# Patient Record
Sex: Male | Born: 1965 | Race: Asian | Hispanic: No | Marital: Married | Smoking: Never smoker
Health system: Southern US, Community
[De-identification: ages and names within clinical notes are randomized; demographics above are authoritative.]

## PROBLEM LIST (undated history)

## (undated) HISTORY — PX: APPENDECTOMY: SHX54

---

## 2010-11-15 ENCOUNTER — Encounter (INDEPENDENT_AMBULATORY_CARE_PROVIDER_SITE_OTHER): Payer: Self-pay | Admitting: *Deleted

## 2010-11-21 NOTE — Letter (Signed)
Summary: New Patient letter  [facility]  [address] [place], [place] [address]   Phone: [phone]  Fax: [phone]       11/15/2010 MRN: [id number]  Douglas Vaughan [address] [place], [place]  [address]  Dear Douglas Vaughan,  Welcome to the Gastroenterology Division at [facility].    You are scheduled to see Dr.  Leone Payor on 01-01-11 at 2:30P.M. on the 3rd floor at [facility], [address]  We ask that you try to arrive at our office 15 minutes prior to your appointment time to allow for check-in.  We would like you to complete the enclosed self-administered evaluation form prior to your visit and bring it with you on the day of your appointment.  We will review it with you.  Also, please bring a complete list of all your medications or, if you prefer, bring the medication bottles and we will list them.  Please bring your insurance card so that we may make a copy of it.  If your insurance requires a referral to see a specialist, please bring your referral form from your primary care physician.  Co-payments are due at the time of your visit and may be paid by cash, check or credit card.     Your office visit will consist of a consult with your physician (includes a physical exam), any laboratory testing he/she may order, scheduling of any necessary diagnostic testing (e.g. x-ray, ultrasound, CT-scan), and scheduling of a procedure (e.g. Endoscopy, Colonoscopy) if required.  Please allow enough time on your schedule to allow for any/all of these possibilities.    If you cannot keep your appointment, please call [phone] to cancel or reschedule prior to your appointment date.  This allows [place] the opportunity to schedule an appointment for another patient in need of care.  If you do not cancel or reschedule by 5 p.m. the business day prior to your appointment date, you will be charged a $50.00 late cancellation/no-show fee.    Thank you for choosing  [facility] for your medical needs.  We appreciate the opportunity to care for you.  Please visit [place] at our website  to learn more about our practice.                     Sincerely,                                                             The Gastroenterology Division

## 2011-01-01 ENCOUNTER — Ambulatory Visit: Payer: Self-pay | Admitting: Internal Medicine

## 2013-02-11 ENCOUNTER — Other Ambulatory Visit (HOSPITAL_BASED_OUTPATIENT_CLINIC_OR_DEPARTMENT_OTHER): Payer: Self-pay | Admitting: Family Medicine

## 2013-02-11 DIAGNOSIS — R6889 Other general symptoms and signs: Secondary | ICD-10-CM

## 2013-02-16 ENCOUNTER — Ambulatory Visit (HOSPITAL_BASED_OUTPATIENT_CLINIC_OR_DEPARTMENT_OTHER)
Admission: RE | Admit: 2013-02-16 | Discharge: 2013-02-16 | Disposition: A | Discharge status: Home / Self Care | Payer: BC Managed Care – PPO | Source: Ambulatory Visit | Attending: Family Medicine | Admitting: Family Medicine

## 2013-02-16 DIAGNOSIS — J3489 Other specified disorders of nose and nasal sinuses: Secondary | ICD-10-CM | POA: Insufficient documentation

## 2013-02-16 DIAGNOSIS — R6889 Other general symptoms and signs: Secondary | ICD-10-CM

## 2013-02-16 DIAGNOSIS — R0989 Other specified symptoms and signs involving the circulatory and respiratory systems: Secondary | ICD-10-CM | POA: Insufficient documentation

## 2017-03-29 DIAGNOSIS — Z0000 Encounter for general adult medical examination without abnormal findings: Secondary | ICD-10-CM | POA: Diagnosis not present

## 2017-03-29 DIAGNOSIS — Z136 Encounter for screening for cardiovascular disorders: Secondary | ICD-10-CM | POA: Diagnosis not present

## 2018-03-29 ENCOUNTER — Emergency Department (HOSPITAL_BASED_OUTPATIENT_CLINIC_OR_DEPARTMENT_OTHER)
Admission: EM | Admit: 2018-03-29 | Discharge: 2018-03-30 | Disposition: A | Discharge status: Home / Self Care | Payer: 59 | Attending: Emergency Medicine | Admitting: Emergency Medicine

## 2018-03-29 ENCOUNTER — Encounter (HOSPITAL_BASED_OUTPATIENT_CLINIC_OR_DEPARTMENT_OTHER): Payer: Self-pay | Admitting: *Deleted

## 2018-03-29 ENCOUNTER — Other Ambulatory Visit: Payer: Self-pay

## 2018-03-29 ENCOUNTER — Emergency Department (HOSPITAL_BASED_OUTPATIENT_CLINIC_OR_DEPARTMENT_OTHER): Payer: 59

## 2018-03-29 DIAGNOSIS — Y929 Unspecified place or not applicable: Secondary | ICD-10-CM | POA: Diagnosis not present

## 2018-03-29 DIAGNOSIS — W1789XA Other fall from one level to another, initial encounter: Secondary | ICD-10-CM | POA: Diagnosis not present

## 2018-03-29 DIAGNOSIS — S52502A Unspecified fracture of the lower end of left radius, initial encounter for closed fracture: Secondary | ICD-10-CM | POA: Diagnosis not present

## 2018-03-29 DIAGNOSIS — S59902A Unspecified injury of left elbow, initial encounter: Secondary | ICD-10-CM | POA: Diagnosis present

## 2018-03-29 DIAGNOSIS — Y999 Unspecified external cause status: Secondary | ICD-10-CM | POA: Diagnosis not present

## 2018-03-29 DIAGNOSIS — Y939 Activity, unspecified: Secondary | ICD-10-CM | POA: Diagnosis not present

## 2018-03-29 DIAGNOSIS — S52122A Displaced fracture of head of left radius, initial encounter for closed fracture: Secondary | ICD-10-CM | POA: Diagnosis not present

## 2018-03-29 DIAGNOSIS — S52352A Displaced comminuted fracture of shaft of radius, left arm, initial encounter for closed fracture: Secondary | ICD-10-CM | POA: Diagnosis not present

## 2018-03-29 MED ORDER — IBUPROFEN 400 MG PO TABS
600.0000 mg | ORAL_TABLET | Freq: Once | ORAL | Status: AC
  Administered 2018-03-29: 600 mg via ORAL
  Filled 2018-03-29: qty 1

## 2018-03-29 MED ORDER — OXYCODONE-ACETAMINOPHEN 5-325 MG PO TABS
1.0000 | ORAL_TABLET | Freq: Once | ORAL | Status: AC
  Administered 2018-03-29: 1 via ORAL
  Filled 2018-03-29: qty 1

## 2018-03-29 MED ORDER — OXYCODONE-ACETAMINOPHEN 5-325 MG PO TABS: 1.0000 | ORAL_TABLET | Freq: Four times a day (QID) | ORAL | 0 refills | Status: AC | PRN

## 2018-03-29 MED ORDER — NAPROXEN 500 MG PO TABS: 500.0000 mg | ORAL_TABLET | Freq: Two times a day (BID) | ORAL | 0 refills | Status: AC

## 2018-03-29 NOTE — ED Notes (Signed)
PMS intact before and after. Pt tolerated well. All questions answered. 

## 2018-03-29 NOTE — ED Notes (Signed)
Wrist re-splinted for XR.

## 2018-03-29 NOTE — ED Triage Notes (Signed)
Pt states he fell off boat trailer onto grass. C/o pain in left wrist which he splinted with chopsticks pta

## 2018-03-29 NOTE — ED Provider Notes (Signed)
[facility] EMERGENCY DEPARTMENT Provider Note   CSN: [id number] Arrival date & time: 03/29/18  2213     History   Chief Complaint Chief Complaint  Patient presents with  . Fall    HPI Douglas Vaughan is a 52 y.o. male.  Patient presents with acute onset of left wrist pain after falling several feet off of a boat trailer onto grass.  Patient fell onto an outstretched hand.  He had immediate pain.  Patient immobilized himself in a homemade splint.  Incident occurred around 9 PM.  Patient hit the front of his head but did not lose consciousness.  He has not had any vomiting, vision change, weakness in the arms or his legs.  He is not confused and is acting normally per his wife.  No treatments prior to arrival.  Onset of symptoms acute.  Course is constant.  Movement makes the pain worse.  Nothing makes it better.     History reviewed. No pertinent past medical history.  There are no active problems to display for this patient.   Past Surgical History:  Procedure Laterality Date  . APPENDECTOMY          Home Medications    Prior to Admission medications   Medication Sig Start Date End Date Taking? Authorizing Provider  naproxen (NAPROSYN) 500 MG tablet Take 1 tablet (500 mg total) by mouth 2 (two) times daily. 03/29/18   Renne CriglerGeiple, Katsumi Wisler, PA-C  oxyCODONE-acetaminophen (PERCOCET/ROXICET) 5-325 MG tablet Take 1 tablet by mouth every 6 (six) hours as needed for severe pain. 03/29/18   Renne CriglerGeiple, Chene Kasinger, PA-C    Family History No family history on file.  Social History Social History   Tobacco Use  . Smoking status: Never Smoker  . Smokeless tobacco: Never Used  Substance Use Topics  . Alcohol use: Never    Frequency: Never  . Drug use: Never     Allergies   Patient has no known allergies.   Review of Systems Review of Systems  Constitutional: Negative for activity change and fatigue.  HENT: Negative for tinnitus.   Eyes: Negative for photophobia, pain  and visual disturbance.  Respiratory: Negative for shortness of breath.   Cardiovascular: Negative for chest pain.  Gastrointestinal: Negative for nausea and vomiting.  Musculoskeletal: Positive for arthralgias. Negative for back pain, gait problem, joint swelling and neck pain.  Skin: Negative for wound.  Neurological: Negative for dizziness, weakness, light-headedness, numbness and headaches.  Psychiatric/Behavioral: Negative for confusion and decreased concentration.     Physical Exam Updated Vital Signs BP 112/70 (BP Location: Left Arm)   Pulse 65   Temp 98.3 F (36.8 C) (Oral)   Resp 20   Ht 5\' 11"  (1.803 m)   Wt 81.6 kg (180 lb)   SpO2 100%   BMI 25.10 kg/m   Physical Exam  Constitutional: He is oriented to person, place, and time. He appears well-developed and well-nourished.  HENT:  Head: Normocephalic. Head is without raccoon's eyes and without Battle's sign.  Right Ear: Tympanic membrane, external ear and ear canal normal. No hemotympanum.  Left Ear: Tympanic membrane, external ear and ear canal normal. No hemotympanum.  Nose: Nose normal. No nasal septal hematoma.  Mouth/Throat: Oropharynx is clear and moist.  Minimal forehead abrasion  Eyes: Pupils are equal, round, and reactive to light. Conjunctivae, EOM and lids are normal.  No visible hyphema  Neck: Normal range of motion. Neck supple.  Cardiovascular: Normal rate, regular rhythm and normal pulses. Exam reveals no  decreased pulses.  Pulses:      Radial pulses are 2+ on the right side, and 2+ on the left side.  Pulmonary/Chest: Effort normal and breath sounds normal.  Abdominal: Soft. There is no tenderness.  Musculoskeletal: He exhibits tenderness. He exhibits no edema.       Left shoulder: Normal.       Left elbow: Normal.       Left wrist: He exhibits decreased range of motion, tenderness and bony tenderness.       Cervical back: He exhibits normal range of motion, no tenderness and no bony tenderness.         Thoracic back: He exhibits no tenderness and no bony tenderness.       Lumbar back: He exhibits no tenderness and no bony tenderness.       Left forearm: He exhibits tenderness, bony tenderness, swelling and deformity (minimal). He exhibits no laceration.       Left hand: He exhibits normal range of motion and no tenderness.  Neurological: He is alert and oriented to person, place, and time. He has normal strength and normal reflexes. No cranial nerve deficit or sensory deficit. Coordination normal. GCS eye subscore is 4. GCS verbal subscore is 5. GCS motor subscore is 6.  Motor, sensation, and vascular distal to the injury is fully intact.   Skin: Skin is warm and dry.  Psychiatric: He has a normal mood and affect.  Nursing note and vitals reviewed.    ED Treatments / Results  Labs (all labs ordered are listed, but only abnormal results are displayed) Labs Reviewed - No data to display  EKG None  Radiology Dg Wrist Complete Left  Result Date: 03/29/2018 CLINICAL DATA:  Fall from boat trailer onto outstretched hand. Left wrist pain and deformity. Initial encounter. EXAM: LEFT WRIST - COMPLETE 3+ VIEW COMPARISON:  None. FINDINGS: A comminuted fracture of the distal radius is seen with involvement of the distal radial ulnar and radiocarpal joints. There is moderate dorsal angulation of the distal articular surface the radius. No other fractures identified. Carpal bones remain normal alignment. IMPRESSION: Comminuted fracture of distal radius, with moderate dorsal angulation. Electronically Signed   By: Myles Rosenthal M.D.   On: 03/29/2018 23:02    Procedures Procedures (including critical care time)  Medications Ordered in ED Medications  oxyCODONE-acetaminophen (PERCOCET/ROXICET) 5-325 MG per tablet 1 tablet (1 tablet Oral Given 03/29/18 2328)  ibuprofen (ADVIL,MOTRIN) tablet 600 mg (600 mg Oral Given 03/29/18 2328)     Initial Impression / Assessment and Plan / ED Course  I  have reviewed the triage vital signs and the nursing notes.  Pertinent labs & imaging results that were available during my care of the patient were reviewed by me and considered in my medical decision making (see chart for details).     Patient seen and examined. X-ray reviewed with patient and wife. Medications ordered.   Vital signs reviewed and are as follows: BP 112/70 (BP Location: Left Arm)   Pulse 65   Temp 98.3 F (36.8 C) (Oral)   Resp 20   Ht 5\' 11"  (1.803 m)   Wt 81.6 kg (180 lb)   SpO2 100%   BMI 25.10 kg/m   Patient will be placed in a sugar tong splint.  Orthopedic follow-up given.  Patient will be discharged home with pain medication.  We discussed use of narcotic pain medication.   Patient counseled on use of narcotic pain medications. Counseled not to combine these  medications with others containing tylenol. Urged not to drink alcohol, drive, or perform any other activities that requires focus while taking these medications. The patient verbalizes understanding and agrees with the plan.   Final Clinical Impressions(s) / ED Diagnoses   Final diagnoses:  Closed fracture of distal end of left radius, unspecified fracture morphology, initial encounter   Patient with closed distal radius fracture, neurovascularly intact.  No elbow or shoulder pain.  Minor head injury but patient with normal exam, no indication for head imaging based on [place] head CT rules.  ED Discharge Orders        Ordered    oxyCODONE-acetaminophen (PERCOCET/ROXICET) 5-325 MG tablet  Every 6 hours PRN     03/29/18 2332    naproxen (NAPROSYN) 500 MG tablet  2 times daily     03/29/18 2332       Renne Crigler, PA-C 03/29/18 2345    Tegeler, Canary Brim, MD 03/30/18 [phone]

## 2018-03-29 NOTE — Discharge Instructions (Signed)
Please read and follow all provided instructions.  Your diagnoses today include:  1. Closed fracture of distal end of left radius, unspecified fracture morphology, initial encounter     Tests performed today include:  An x-ray of the affected area - shows distal radius fracture  Vital signs. See below for your results today.   Medications prescribed:   Percocet (oxycodone/acetaminophen) - narcotic pain medication  DO NOT drive or perform any activities that require you to be awake and alert because this medicine can make you drowsy. BE VERY CAREFUL not to take multiple medicines containing Tylenol (also called acetaminophen). Doing so can lead to an overdose which can damage your liver and cause liver failure and possibly death.   Naproxen - anti-inflammatory pain medication  Do not exceed 500mg  naproxen every 12 hours, take with food  You have been prescribed an anti-inflammatory medication or NSAID. Take with food. Take smallest effective dose for the shortest duration needed for your pain. Stop taking if you experience stomach pain or vomiting.   Take any prescribed medications only as directed.  Home care instructions:   Follow any educational materials contained in this packet  Follow R.I.C.E. Protocol:  R - rest your injury   I  - use ice on injury without applying directly to skin  C - compress injury with bandage or splint  E - elevate the injury as much as possible  Follow-up instructions: Please follow-up with Dr. Amanda PeaGramig next week.   Return instructions:   Please return if your toes or feet are numb or tingling, appear gray or blue, or you have severe pain (also elevate the leg and loosen splint or wrap if you were given one)  Please return to the Emergency Department if you experience worsening symptoms.   Please return if you have any other emergent concerns.  Additional Information:  Your vital signs today were: BP 112/70 (BP Location: Left Arm)     Pulse 65    Temp 98.3 F (36.8 C) (Oral)    Resp 20    Ht 5\' 11"  (1.803 m)    Wt 81.6 kg (180 lb)    SpO2 100%    BMI 25.10 kg/m  If your blood pressure (BP) was elevated above 135/85 this visit, please have this repeated by your doctor within one month. --------------

## 2018-03-31 DIAGNOSIS — S52572A Other intraarticular fracture of lower end of left radius, initial encounter for closed fracture: Secondary | ICD-10-CM | POA: Diagnosis not present

## 2018-04-07 DIAGNOSIS — Z1211 Encounter for screening for malignant neoplasm of colon: Secondary | ICD-10-CM | POA: Diagnosis not present

## 2018-04-07 DIAGNOSIS — Z131 Encounter for screening for diabetes mellitus: Secondary | ICD-10-CM | POA: Diagnosis not present

## 2018-04-07 DIAGNOSIS — E7800 Pure hypercholesterolemia, unspecified: Secondary | ICD-10-CM | POA: Diagnosis not present

## 2018-04-07 DIAGNOSIS — Z1159 Encounter for screening for other viral diseases: Secondary | ICD-10-CM | POA: Diagnosis not present

## 2018-04-07 DIAGNOSIS — Z23 Encounter for immunization: Secondary | ICD-10-CM | POA: Diagnosis not present

## 2018-04-07 DIAGNOSIS — Z125 Encounter for screening for malignant neoplasm of prostate: Secondary | ICD-10-CM | POA: Diagnosis not present

## 2018-04-07 DIAGNOSIS — Z0000 Encounter for general adult medical examination without abnormal findings: Secondary | ICD-10-CM | POA: Diagnosis not present

## 2018-04-09 DIAGNOSIS — S52572D Other intraarticular fracture of lower end of left radius, subsequent encounter for closed fracture with routine healing: Secondary | ICD-10-CM | POA: Diagnosis not present

## 2018-04-09 DIAGNOSIS — S52502D Unspecified fracture of the lower end of left radius, subsequent encounter for closed fracture with routine healing: Secondary | ICD-10-CM | POA: Diagnosis not present

## 2018-04-11 DIAGNOSIS — S52572D Other intraarticular fracture of lower end of left radius, subsequent encounter for closed fracture with routine healing: Secondary | ICD-10-CM | POA: Diagnosis not present

## 2018-04-11 DIAGNOSIS — G8918 Other acute postprocedural pain: Secondary | ICD-10-CM | POA: Diagnosis not present

## 2018-04-11 DIAGNOSIS — S52572A Other intraarticular fracture of lower end of left radius, initial encounter for closed fracture: Secondary | ICD-10-CM | POA: Diagnosis not present

## 2018-04-15 DIAGNOSIS — R7303 Prediabetes: Secondary | ICD-10-CM | POA: Diagnosis not present

## 2018-04-25 DIAGNOSIS — M8580 Other specified disorders of bone density and structure, unspecified site: Secondary | ICD-10-CM | POA: Diagnosis not present

## 2018-04-25 DIAGNOSIS — S52502D Unspecified fracture of the lower end of left radius, subsequent encounter for closed fracture with routine healing: Secondary | ICD-10-CM | POA: Diagnosis not present

## 2018-04-25 DIAGNOSIS — Z9889 Other specified postprocedural states: Secondary | ICD-10-CM | POA: Diagnosis not present

## 2018-05-28 DIAGNOSIS — S52502D Unspecified fracture of the lower end of left radius, subsequent encounter for closed fracture with routine healing: Secondary | ICD-10-CM | POA: Diagnosis not present

## 2018-06-03 DIAGNOSIS — M25642 Stiffness of left hand, not elsewhere classified: Secondary | ICD-10-CM | POA: Diagnosis not present

## 2018-06-03 DIAGNOSIS — S52502D Unspecified fracture of the lower end of left radius, subsequent encounter for closed fracture with routine healing: Secondary | ICD-10-CM | POA: Diagnosis not present

## 2018-06-06 DIAGNOSIS — M25642 Stiffness of left hand, not elsewhere classified: Secondary | ICD-10-CM | POA: Diagnosis not present

## 2018-06-06 DIAGNOSIS — S52502D Unspecified fracture of the lower end of left radius, subsequent encounter for closed fracture with routine healing: Secondary | ICD-10-CM | POA: Diagnosis not present

## 2018-06-10 DIAGNOSIS — S52502D Unspecified fracture of the lower end of left radius, subsequent encounter for closed fracture with routine healing: Secondary | ICD-10-CM | POA: Diagnosis not present

## 2018-06-10 DIAGNOSIS — M25642 Stiffness of left hand, not elsewhere classified: Secondary | ICD-10-CM | POA: Diagnosis not present

## 2018-06-13 DIAGNOSIS — S52502D Unspecified fracture of the lower end of left radius, subsequent encounter for closed fracture with routine healing: Secondary | ICD-10-CM | POA: Diagnosis not present

## 2018-06-13 DIAGNOSIS — M25642 Stiffness of left hand, not elsewhere classified: Secondary | ICD-10-CM | POA: Diagnosis not present

## 2018-06-15 DIAGNOSIS — Z23 Encounter for immunization: Secondary | ICD-10-CM | POA: Diagnosis not present

## 2018-06-17 DIAGNOSIS — S52502D Unspecified fracture of the lower end of left radius, subsequent encounter for closed fracture with routine healing: Secondary | ICD-10-CM | POA: Diagnosis not present

## 2018-06-17 DIAGNOSIS — M25642 Stiffness of left hand, not elsewhere classified: Secondary | ICD-10-CM | POA: Diagnosis not present

## 2018-06-20 DIAGNOSIS — M25642 Stiffness of left hand, not elsewhere classified: Secondary | ICD-10-CM | POA: Diagnosis not present

## 2018-06-20 DIAGNOSIS — S52502D Unspecified fracture of the lower end of left radius, subsequent encounter for closed fracture with routine healing: Secondary | ICD-10-CM | POA: Diagnosis not present

## 2018-06-24 DIAGNOSIS — S52502D Unspecified fracture of the lower end of left radius, subsequent encounter for closed fracture with routine healing: Secondary | ICD-10-CM | POA: Diagnosis not present

## 2018-06-24 DIAGNOSIS — M25642 Stiffness of left hand, not elsewhere classified: Secondary | ICD-10-CM | POA: Diagnosis not present

## 2018-06-27 DIAGNOSIS — M25642 Stiffness of left hand, not elsewhere classified: Secondary | ICD-10-CM | POA: Diagnosis not present

## 2018-06-27 DIAGNOSIS — S52502D Unspecified fracture of the lower end of left radius, subsequent encounter for closed fracture with routine healing: Secondary | ICD-10-CM | POA: Diagnosis not present

## 2018-07-01 DIAGNOSIS — S52502D Unspecified fracture of the lower end of left radius, subsequent encounter for closed fracture with routine healing: Secondary | ICD-10-CM | POA: Diagnosis not present

## 2018-07-01 DIAGNOSIS — M25642 Stiffness of left hand, not elsewhere classified: Secondary | ICD-10-CM | POA: Diagnosis not present

## 2018-07-04 DIAGNOSIS — M25642 Stiffness of left hand, not elsewhere classified: Secondary | ICD-10-CM | POA: Diagnosis not present

## 2018-07-04 DIAGNOSIS — S52502D Unspecified fracture of the lower end of left radius, subsequent encounter for closed fracture with routine healing: Secondary | ICD-10-CM | POA: Diagnosis not present

## 2018-07-08 DIAGNOSIS — S52502D Unspecified fracture of the lower end of left radius, subsequent encounter for closed fracture with routine healing: Secondary | ICD-10-CM | POA: Diagnosis not present

## 2018-07-08 DIAGNOSIS — M25642 Stiffness of left hand, not elsewhere classified: Secondary | ICD-10-CM | POA: Diagnosis not present

## 2018-07-11 DIAGNOSIS — M25642 Stiffness of left hand, not elsewhere classified: Secondary | ICD-10-CM | POA: Diagnosis not present

## 2018-07-11 DIAGNOSIS — S52502D Unspecified fracture of the lower end of left radius, subsequent encounter for closed fracture with routine healing: Secondary | ICD-10-CM | POA: Diagnosis not present

## 2018-07-13 DIAGNOSIS — Z23 Encounter for immunization: Secondary | ICD-10-CM | POA: Diagnosis not present

## 2018-07-15 DIAGNOSIS — S52502D Unspecified fracture of the lower end of left radius, subsequent encounter for closed fracture with routine healing: Secondary | ICD-10-CM | POA: Diagnosis not present

## 2018-07-15 DIAGNOSIS — M25642 Stiffness of left hand, not elsewhere classified: Secondary | ICD-10-CM | POA: Diagnosis not present

## 2018-07-22 DIAGNOSIS — M25642 Stiffness of left hand, not elsewhere classified: Secondary | ICD-10-CM | POA: Diagnosis not present

## 2018-07-25 DIAGNOSIS — M25642 Stiffness of left hand, not elsewhere classified: Secondary | ICD-10-CM | POA: Diagnosis not present

## 2018-07-29 DIAGNOSIS — M25642 Stiffness of left hand, not elsewhere classified: Secondary | ICD-10-CM | POA: Diagnosis not present

## 2018-07-29 DIAGNOSIS — S52502D Unspecified fracture of the lower end of left radius, subsequent encounter for closed fracture with routine healing: Secondary | ICD-10-CM | POA: Diagnosis not present

## 2018-07-30 DIAGNOSIS — Z1211 Encounter for screening for malignant neoplasm of colon: Secondary | ICD-10-CM | POA: Diagnosis not present

## 2018-07-30 DIAGNOSIS — S52502D Unspecified fracture of the lower end of left radius, subsequent encounter for closed fracture with routine healing: Secondary | ICD-10-CM | POA: Diagnosis not present

## 2018-07-30 DIAGNOSIS — M7541 Impingement syndrome of right shoulder: Secondary | ICD-10-CM | POA: Diagnosis not present

## 2018-08-01 DIAGNOSIS — S52502D Unspecified fracture of the lower end of left radius, subsequent encounter for closed fracture with routine healing: Secondary | ICD-10-CM | POA: Diagnosis not present

## 2018-08-01 DIAGNOSIS — M25642 Stiffness of left hand, not elsewhere classified: Secondary | ICD-10-CM | POA: Diagnosis not present

## 2018-08-04 DIAGNOSIS — S52502D Unspecified fracture of the lower end of left radius, subsequent encounter for closed fracture with routine healing: Secondary | ICD-10-CM | POA: Diagnosis not present

## 2018-08-04 DIAGNOSIS — M25642 Stiffness of left hand, not elsewhere classified: Secondary | ICD-10-CM | POA: Diagnosis not present

## 2018-08-06 DIAGNOSIS — M25642 Stiffness of left hand, not elsewhere classified: Secondary | ICD-10-CM | POA: Diagnosis not present

## 2018-08-06 DIAGNOSIS — S52502D Unspecified fracture of the lower end of left radius, subsequent encounter for closed fracture with routine healing: Secondary | ICD-10-CM | POA: Diagnosis not present

## 2018-08-14 DIAGNOSIS — M25642 Stiffness of left hand, not elsewhere classified: Secondary | ICD-10-CM | POA: Diagnosis not present

## 2018-08-14 DIAGNOSIS — S52502D Unspecified fracture of the lower end of left radius, subsequent encounter for closed fracture with routine healing: Secondary | ICD-10-CM | POA: Diagnosis not present

## 2018-08-20 DIAGNOSIS — M25642 Stiffness of left hand, not elsewhere classified: Secondary | ICD-10-CM | POA: Diagnosis not present

## 2018-08-20 DIAGNOSIS — S52502D Unspecified fracture of the lower end of left radius, subsequent encounter for closed fracture with routine healing: Secondary | ICD-10-CM | POA: Diagnosis not present

## 2018-08-22 DIAGNOSIS — Z23 Encounter for immunization: Secondary | ICD-10-CM | POA: Diagnosis not present

## 2018-08-26 DIAGNOSIS — M25642 Stiffness of left hand, not elsewhere classified: Secondary | ICD-10-CM | POA: Diagnosis not present

## 2018-08-26 DIAGNOSIS — S52502D Unspecified fracture of the lower end of left radius, subsequent encounter for closed fracture with routine healing: Secondary | ICD-10-CM | POA: Diagnosis not present

## 2018-08-27 DIAGNOSIS — S52502D Unspecified fracture of the lower end of left radius, subsequent encounter for closed fracture with routine healing: Secondary | ICD-10-CM | POA: Diagnosis not present

## 2018-08-27 DIAGNOSIS — M25642 Stiffness of left hand, not elsewhere classified: Secondary | ICD-10-CM | POA: Diagnosis not present

## 2018-08-28 DIAGNOSIS — D120 Benign neoplasm of cecum: Secondary | ICD-10-CM | POA: Diagnosis not present

## 2018-08-28 DIAGNOSIS — D123 Benign neoplasm of transverse colon: Secondary | ICD-10-CM | POA: Diagnosis not present

## 2018-08-28 DIAGNOSIS — D125 Benign neoplasm of sigmoid colon: Secondary | ICD-10-CM | POA: Diagnosis not present

## 2018-08-28 DIAGNOSIS — Z1211 Encounter for screening for malignant neoplasm of colon: Secondary | ICD-10-CM | POA: Diagnosis not present

## 2018-08-28 DIAGNOSIS — K635 Polyp of colon: Secondary | ICD-10-CM | POA: Diagnosis not present

## 2018-08-29 DIAGNOSIS — Z713 Dietary counseling and surveillance: Secondary | ICD-10-CM | POA: Diagnosis not present

## 2018-08-29 DIAGNOSIS — Z6832 Body mass index (BMI) 32.0-32.9, adult: Secondary | ICD-10-CM | POA: Diagnosis not present

## 2018-09-02 DIAGNOSIS — S52502D Unspecified fracture of the lower end of left radius, subsequent encounter for closed fracture with routine healing: Secondary | ICD-10-CM | POA: Diagnosis not present

## 2018-09-02 DIAGNOSIS — M25642 Stiffness of left hand, not elsewhere classified: Secondary | ICD-10-CM | POA: Diagnosis not present

## 2018-09-12 DIAGNOSIS — S52502D Unspecified fracture of the lower end of left radius, subsequent encounter for closed fracture with routine healing: Secondary | ICD-10-CM | POA: Diagnosis not present

## 2018-09-12 DIAGNOSIS — M25642 Stiffness of left hand, not elsewhere classified: Secondary | ICD-10-CM | POA: Diagnosis not present

## 2018-09-19 DIAGNOSIS — S52502D Unspecified fracture of the lower end of left radius, subsequent encounter for closed fracture with routine healing: Secondary | ICD-10-CM | POA: Diagnosis not present

## 2018-09-19 DIAGNOSIS — M25642 Stiffness of left hand, not elsewhere classified: Secondary | ICD-10-CM | POA: Diagnosis not present

## 2018-09-26 DIAGNOSIS — S52502D Unspecified fracture of the lower end of left radius, subsequent encounter for closed fracture with routine healing: Secondary | ICD-10-CM | POA: Diagnosis not present

## 2018-09-26 DIAGNOSIS — M25642 Stiffness of left hand, not elsewhere classified: Secondary | ICD-10-CM | POA: Diagnosis not present

## 2018-10-03 DIAGNOSIS — M25642 Stiffness of left hand, not elsewhere classified: Secondary | ICD-10-CM | POA: Diagnosis not present

## 2018-10-03 DIAGNOSIS — S52502D Unspecified fracture of the lower end of left radius, subsequent encounter for closed fracture with routine healing: Secondary | ICD-10-CM | POA: Diagnosis not present

## 2018-10-10 DIAGNOSIS — S52502D Unspecified fracture of the lower end of left radius, subsequent encounter for closed fracture with routine healing: Secondary | ICD-10-CM | POA: Diagnosis not present

## 2018-10-10 DIAGNOSIS — M25642 Stiffness of left hand, not elsewhere classified: Secondary | ICD-10-CM | POA: Diagnosis not present

## 2018-10-17 DIAGNOSIS — S52502D Unspecified fracture of the lower end of left radius, subsequent encounter for closed fracture with routine healing: Secondary | ICD-10-CM | POA: Diagnosis not present

## 2018-10-17 DIAGNOSIS — M25642 Stiffness of left hand, not elsewhere classified: Secondary | ICD-10-CM | POA: Diagnosis not present

## 2018-10-27 DIAGNOSIS — S52502D Unspecified fracture of the lower end of left radius, subsequent encounter for closed fracture with routine healing: Secondary | ICD-10-CM | POA: Diagnosis not present

## 2018-10-27 DIAGNOSIS — M25642 Stiffness of left hand, not elsewhere classified: Secondary | ICD-10-CM | POA: Diagnosis not present

## 2018-12-27 IMAGING — CR DG WRIST COMPLETE 3+V*L*
4 series · 4 of 4 positions shown · non-contrast
Comparison: None.

CLINICAL DATA: Fall from boat trailer onto outstretched hand. Left
wrist pain and deformity. Initial encounter.

EXAM:
LEFT WRIST - COMPLETE 3+ VIEW

[x wrist pa left]
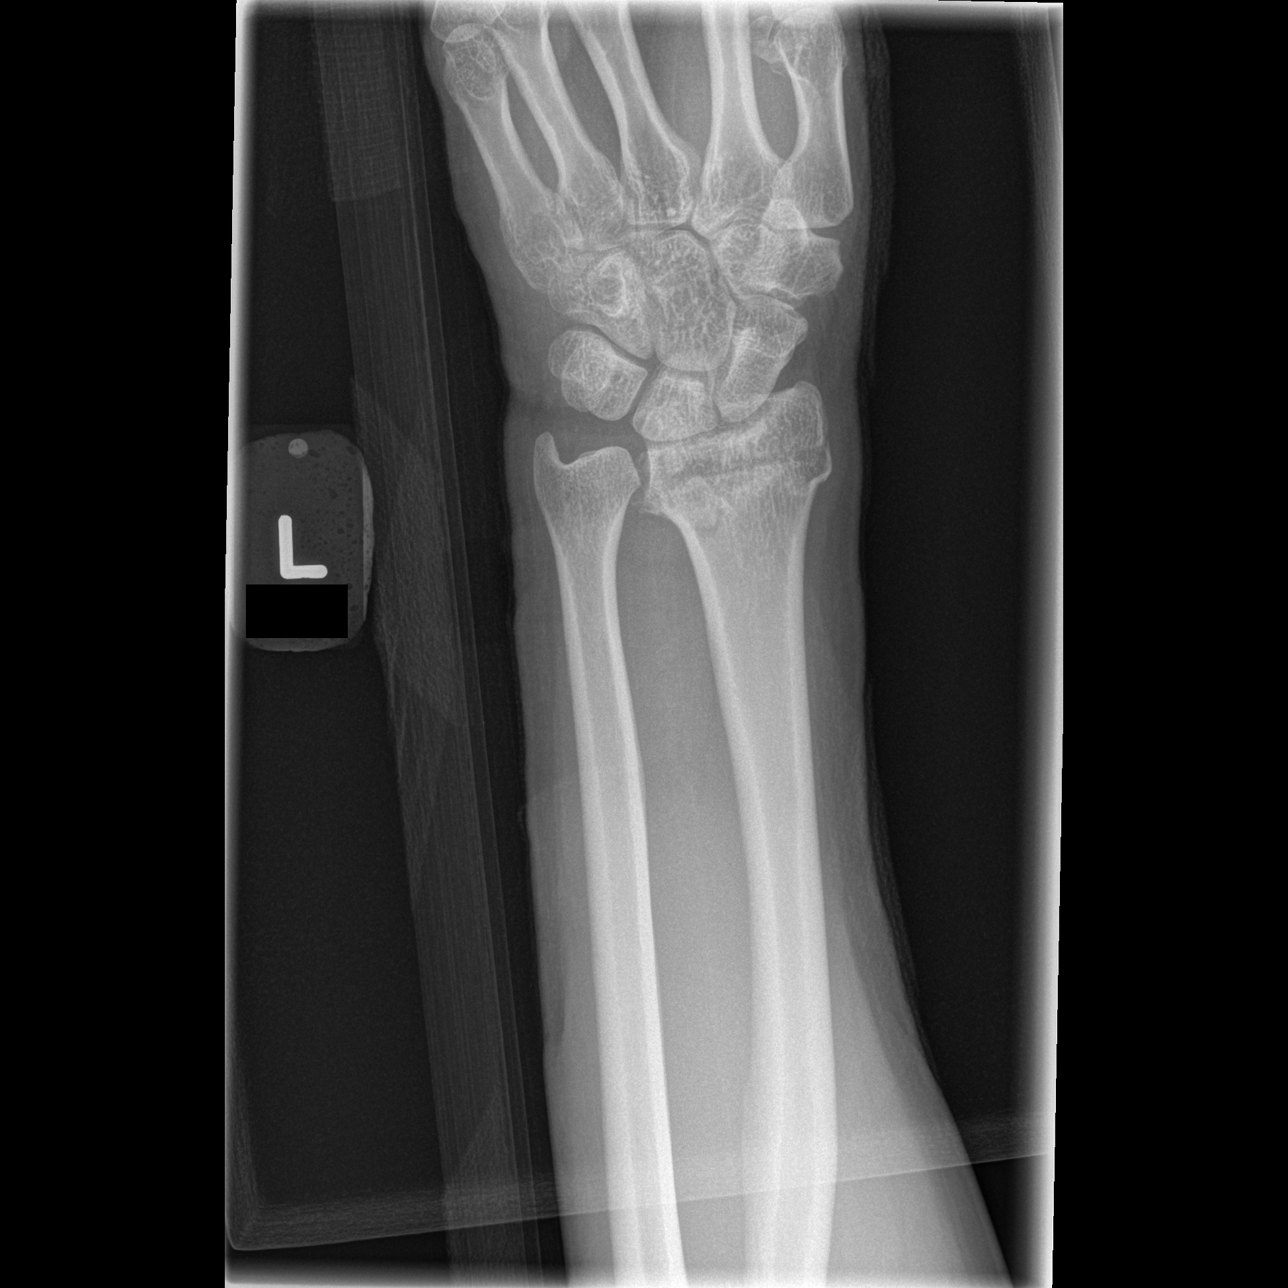

[x wrist obl left]
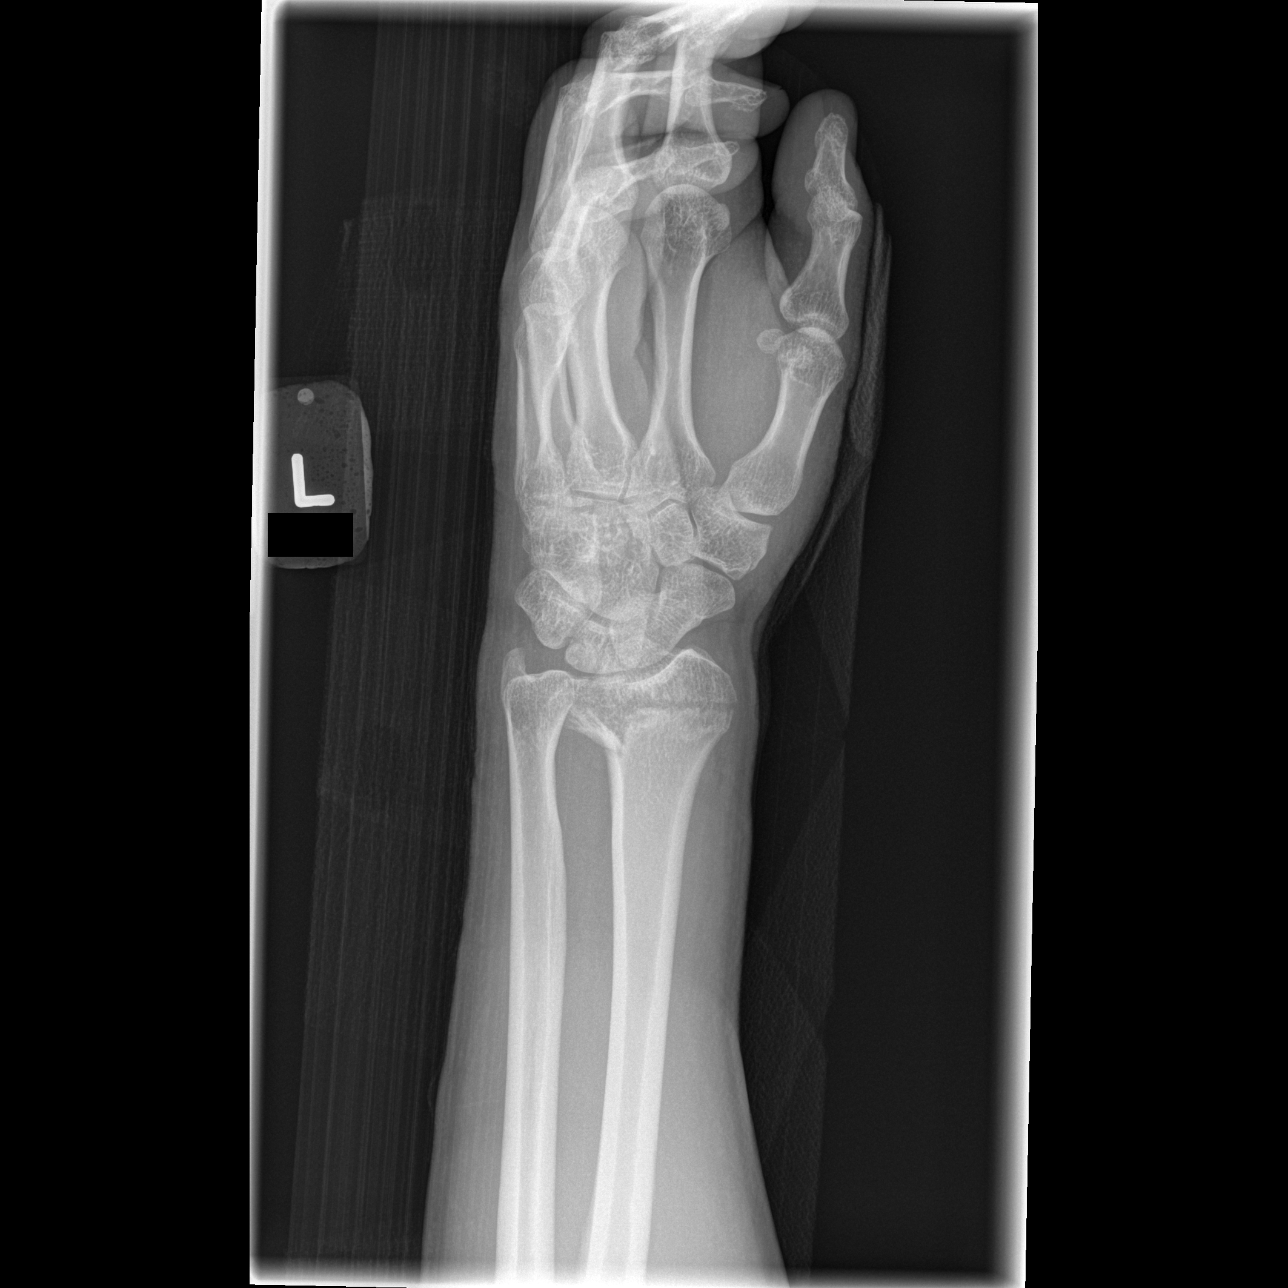

[x wrist lat left]
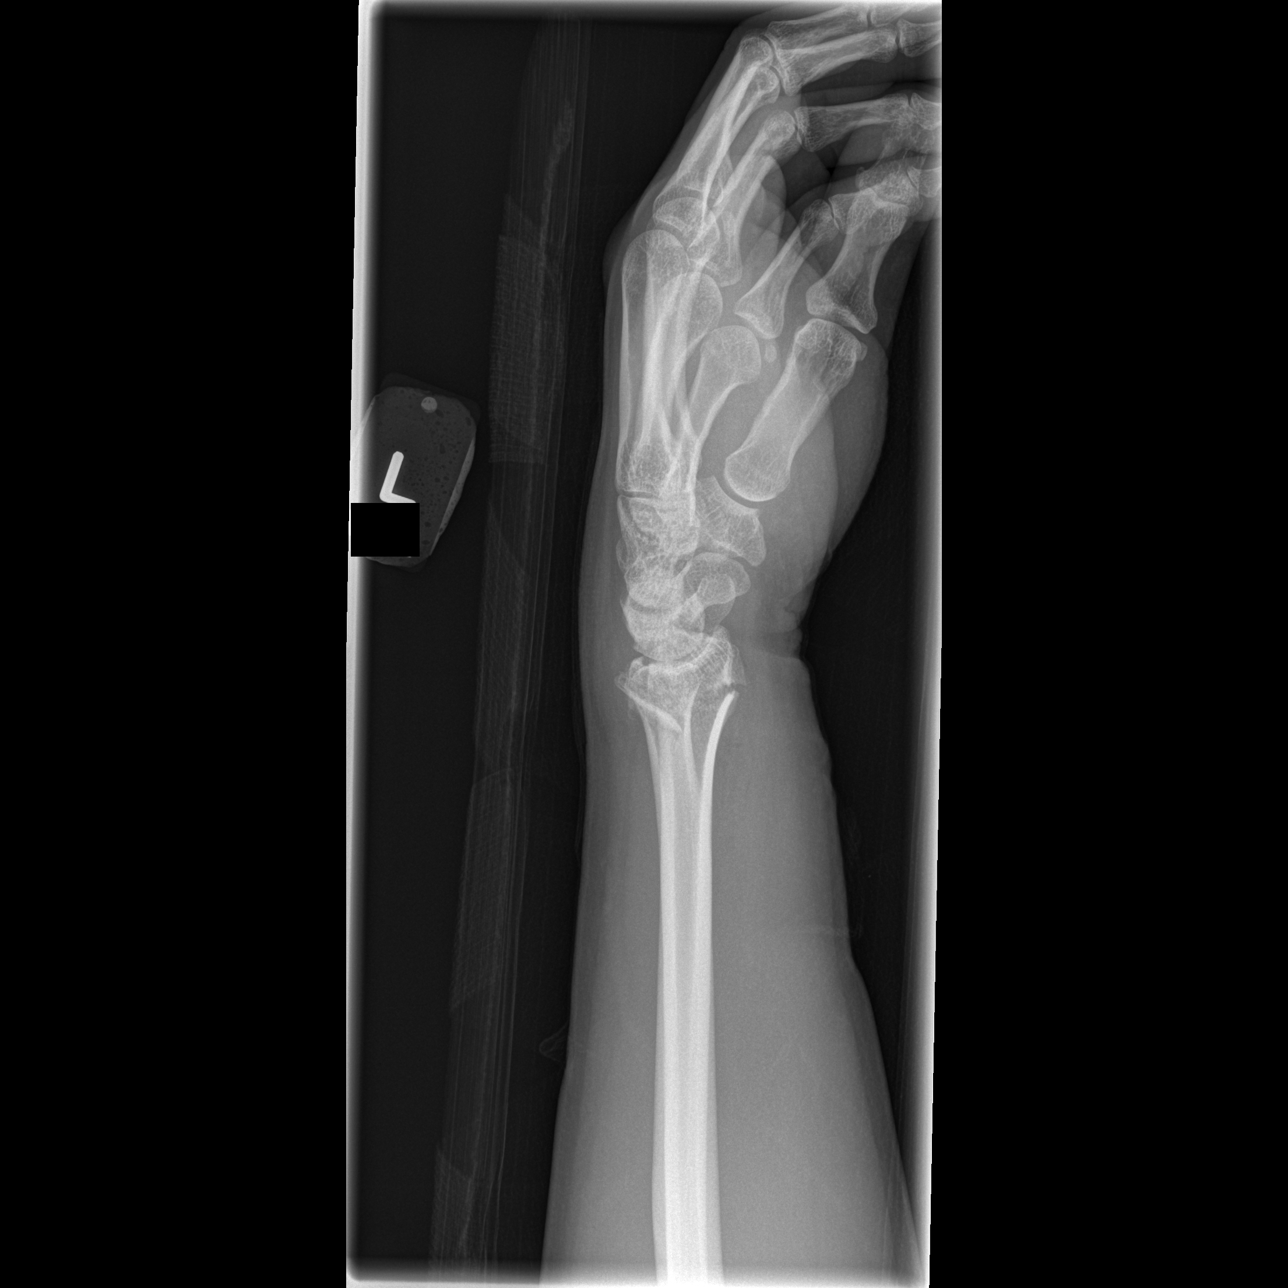

[x navicular]
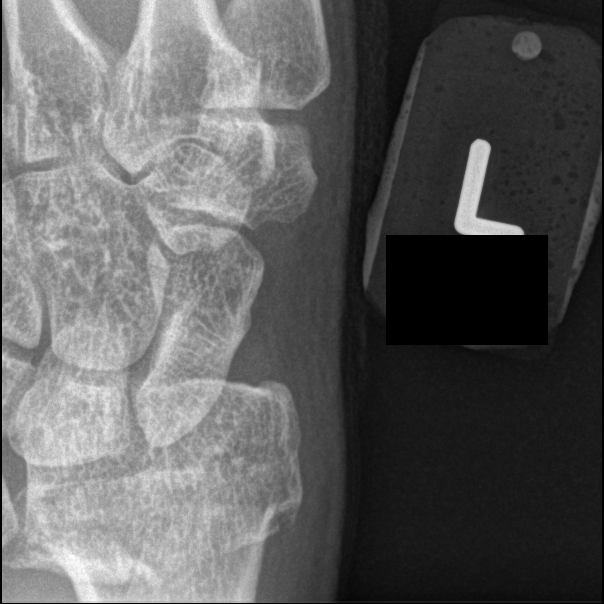

[4 of 4 positions shown; findings below may reference images not displayed]

FINDINGS: A comminuted fracture of the distal radius is seen with involvement
of the distal radial ulnar and radiocarpal joints. There is moderate
dorsal angulation of the distal articular surface the radius. No
other fractures identified. Carpal bones remain normal alignment.
IMPRESSION: Comminuted fracture of distal radius, with moderate dorsal
angulation.

## 2021-12-19 DIAGNOSIS — J301 Allergic rhinitis due to pollen: Secondary | ICD-10-CM | POA: Diagnosis not present

## 2021-12-19 DIAGNOSIS — J3089 Other allergic rhinitis: Secondary | ICD-10-CM | POA: Diagnosis not present

## 2021-12-29 DIAGNOSIS — J301 Allergic rhinitis due to pollen: Secondary | ICD-10-CM | POA: Diagnosis not present

## 2022-02-01 DIAGNOSIS — J301 Allergic rhinitis due to pollen: Secondary | ICD-10-CM | POA: Diagnosis not present

## 2022-02-02 DIAGNOSIS — Z0000 Encounter for general adult medical examination without abnormal findings: Secondary | ICD-10-CM | POA: Diagnosis not present

## 2022-02-02 DIAGNOSIS — E7800 Pure hypercholesterolemia, unspecified: Secondary | ICD-10-CM | POA: Diagnosis not present

## 2022-02-06 DIAGNOSIS — J301 Allergic rhinitis due to pollen: Secondary | ICD-10-CM | POA: Diagnosis not present

## 2022-02-06 DIAGNOSIS — J3081 Allergic rhinitis due to animal (cat) (dog) hair and dander: Secondary | ICD-10-CM | POA: Diagnosis not present

## 2022-02-06 DIAGNOSIS — J3089 Other allergic rhinitis: Secondary | ICD-10-CM | POA: Diagnosis not present

## 2022-02-08 DIAGNOSIS — J3081 Allergic rhinitis due to animal (cat) (dog) hair and dander: Secondary | ICD-10-CM | POA: Diagnosis not present

## 2022-02-08 DIAGNOSIS — J301 Allergic rhinitis due to pollen: Secondary | ICD-10-CM | POA: Diagnosis not present

## 2022-02-08 DIAGNOSIS — J3089 Other allergic rhinitis: Secondary | ICD-10-CM | POA: Diagnosis not present

## 2022-02-12 DIAGNOSIS — J301 Allergic rhinitis due to pollen: Secondary | ICD-10-CM | POA: Diagnosis not present

## 2022-02-14 DIAGNOSIS — J3089 Other allergic rhinitis: Secondary | ICD-10-CM | POA: Diagnosis not present

## 2022-02-14 DIAGNOSIS — J3081 Allergic rhinitis due to animal (cat) (dog) hair and dander: Secondary | ICD-10-CM | POA: Diagnosis not present

## 2022-02-14 DIAGNOSIS — J301 Allergic rhinitis due to pollen: Secondary | ICD-10-CM | POA: Diagnosis not present

## 2022-02-19 DIAGNOSIS — J301 Allergic rhinitis due to pollen: Secondary | ICD-10-CM | POA: Diagnosis not present

## 2022-02-19 DIAGNOSIS — J3081 Allergic rhinitis due to animal (cat) (dog) hair and dander: Secondary | ICD-10-CM | POA: Diagnosis not present

## 2022-02-19 DIAGNOSIS — J3089 Other allergic rhinitis: Secondary | ICD-10-CM | POA: Diagnosis not present

## 2022-02-21 DIAGNOSIS — J3089 Other allergic rhinitis: Secondary | ICD-10-CM | POA: Diagnosis not present

## 2022-02-21 DIAGNOSIS — J301 Allergic rhinitis due to pollen: Secondary | ICD-10-CM | POA: Diagnosis not present

## 2022-02-21 DIAGNOSIS — J3081 Allergic rhinitis due to animal (cat) (dog) hair and dander: Secondary | ICD-10-CM | POA: Diagnosis not present

## 2022-02-23 DIAGNOSIS — J301 Allergic rhinitis due to pollen: Secondary | ICD-10-CM | POA: Diagnosis not present

## 2022-02-23 DIAGNOSIS — J3089 Other allergic rhinitis: Secondary | ICD-10-CM | POA: Diagnosis not present

## 2022-02-23 DIAGNOSIS — J3081 Allergic rhinitis due to animal (cat) (dog) hair and dander: Secondary | ICD-10-CM | POA: Diagnosis not present

## 2022-02-26 DIAGNOSIS — J301 Allergic rhinitis due to pollen: Secondary | ICD-10-CM | POA: Diagnosis not present

## 2022-02-28 DIAGNOSIS — J3089 Other allergic rhinitis: Secondary | ICD-10-CM | POA: Diagnosis not present

## 2022-02-28 DIAGNOSIS — J301 Allergic rhinitis due to pollen: Secondary | ICD-10-CM | POA: Diagnosis not present

## 2022-02-28 DIAGNOSIS — J3081 Allergic rhinitis due to animal (cat) (dog) hair and dander: Secondary | ICD-10-CM | POA: Diagnosis not present

## 2022-03-02 DIAGNOSIS — J3089 Other allergic rhinitis: Secondary | ICD-10-CM | POA: Diagnosis not present

## 2022-03-02 DIAGNOSIS — J301 Allergic rhinitis due to pollen: Secondary | ICD-10-CM | POA: Diagnosis not present

## 2022-03-02 DIAGNOSIS — J3081 Allergic rhinitis due to animal (cat) (dog) hair and dander: Secondary | ICD-10-CM | POA: Diagnosis not present

## 2022-03-05 DIAGNOSIS — J301 Allergic rhinitis due to pollen: Secondary | ICD-10-CM | POA: Diagnosis not present

## 2022-03-05 DIAGNOSIS — J3089 Other allergic rhinitis: Secondary | ICD-10-CM | POA: Diagnosis not present

## 2022-03-09 DIAGNOSIS — J3089 Other allergic rhinitis: Secondary | ICD-10-CM | POA: Diagnosis not present

## 2022-03-09 DIAGNOSIS — J301 Allergic rhinitis due to pollen: Secondary | ICD-10-CM | POA: Diagnosis not present

## 2022-03-09 DIAGNOSIS — J3081 Allergic rhinitis due to animal (cat) (dog) hair and dander: Secondary | ICD-10-CM | POA: Diagnosis not present

## 2022-03-14 DIAGNOSIS — J3081 Allergic rhinitis due to animal (cat) (dog) hair and dander: Secondary | ICD-10-CM | POA: Diagnosis not present

## 2022-03-14 DIAGNOSIS — J3089 Other allergic rhinitis: Secondary | ICD-10-CM | POA: Diagnosis not present

## 2022-03-14 DIAGNOSIS — J301 Allergic rhinitis due to pollen: Secondary | ICD-10-CM | POA: Diagnosis not present

## 2022-03-19 DIAGNOSIS — J301 Allergic rhinitis due to pollen: Secondary | ICD-10-CM | POA: Diagnosis not present

## 2022-03-19 DIAGNOSIS — J3081 Allergic rhinitis due to animal (cat) (dog) hair and dander: Secondary | ICD-10-CM | POA: Diagnosis not present

## 2022-03-19 DIAGNOSIS — J3089 Other allergic rhinitis: Secondary | ICD-10-CM | POA: Diagnosis not present

## 2022-03-23 DIAGNOSIS — J3089 Other allergic rhinitis: Secondary | ICD-10-CM | POA: Diagnosis not present

## 2022-03-23 DIAGNOSIS — J301 Allergic rhinitis due to pollen: Secondary | ICD-10-CM | POA: Diagnosis not present

## 2022-03-23 DIAGNOSIS — J3081 Allergic rhinitis due to animal (cat) (dog) hair and dander: Secondary | ICD-10-CM | POA: Diagnosis not present

## 2022-03-26 DIAGNOSIS — J301 Allergic rhinitis due to pollen: Secondary | ICD-10-CM | POA: Diagnosis not present

## 2022-03-26 DIAGNOSIS — J3089 Other allergic rhinitis: Secondary | ICD-10-CM | POA: Diagnosis not present

## 2022-03-26 DIAGNOSIS — J3081 Allergic rhinitis due to animal (cat) (dog) hair and dander: Secondary | ICD-10-CM | POA: Diagnosis not present

## 2022-04-16 DIAGNOSIS — J3089 Other allergic rhinitis: Secondary | ICD-10-CM | POA: Diagnosis not present

## 2022-04-16 DIAGNOSIS — J301 Allergic rhinitis due to pollen: Secondary | ICD-10-CM | POA: Diagnosis not present

## 2022-04-16 DIAGNOSIS — J3081 Allergic rhinitis due to animal (cat) (dog) hair and dander: Secondary | ICD-10-CM | POA: Diagnosis not present

## 2022-05-07 DIAGNOSIS — J301 Allergic rhinitis due to pollen: Secondary | ICD-10-CM | POA: Diagnosis not present

## 2022-05-07 DIAGNOSIS — J3089 Other allergic rhinitis: Secondary | ICD-10-CM | POA: Diagnosis not present

## 2022-05-07 DIAGNOSIS — J3081 Allergic rhinitis due to animal (cat) (dog) hair and dander: Secondary | ICD-10-CM | POA: Diagnosis not present

## 2022-05-16 DIAGNOSIS — J301 Allergic rhinitis due to pollen: Secondary | ICD-10-CM | POA: Diagnosis not present

## 2022-05-21 DIAGNOSIS — K219 Gastro-esophageal reflux disease without esophagitis: Secondary | ICD-10-CM | POA: Diagnosis not present

## 2022-05-21 DIAGNOSIS — R1012 Left upper quadrant pain: Secondary | ICD-10-CM | POA: Diagnosis not present

## 2022-05-22 DIAGNOSIS — J301 Allergic rhinitis due to pollen: Secondary | ICD-10-CM | POA: Diagnosis not present

## 2022-05-29 DIAGNOSIS — J3081 Allergic rhinitis due to animal (cat) (dog) hair and dander: Secondary | ICD-10-CM | POA: Diagnosis not present

## 2022-05-29 DIAGNOSIS — J3089 Other allergic rhinitis: Secondary | ICD-10-CM | POA: Diagnosis not present

## 2022-05-29 DIAGNOSIS — J301 Allergic rhinitis due to pollen: Secondary | ICD-10-CM | POA: Diagnosis not present

## 2022-06-05 DIAGNOSIS — J301 Allergic rhinitis due to pollen: Secondary | ICD-10-CM | POA: Diagnosis not present

## 2022-06-05 DIAGNOSIS — J3089 Other allergic rhinitis: Secondary | ICD-10-CM | POA: Diagnosis not present

## 2022-06-05 DIAGNOSIS — J3081 Allergic rhinitis due to animal (cat) (dog) hair and dander: Secondary | ICD-10-CM | POA: Diagnosis not present

## 2022-06-13 DIAGNOSIS — J301 Allergic rhinitis due to pollen: Secondary | ICD-10-CM | POA: Diagnosis not present

## 2022-06-18 DIAGNOSIS — R1012 Left upper quadrant pain: Secondary | ICD-10-CM | POA: Diagnosis not present

## 2022-06-20 DIAGNOSIS — J3081 Allergic rhinitis due to animal (cat) (dog) hair and dander: Secondary | ICD-10-CM | POA: Diagnosis not present

## 2022-06-27 DIAGNOSIS — J3089 Other allergic rhinitis: Secondary | ICD-10-CM | POA: Diagnosis not present

## 2022-06-27 DIAGNOSIS — J301 Allergic rhinitis due to pollen: Secondary | ICD-10-CM | POA: Diagnosis not present

## 2022-06-27 DIAGNOSIS — J3081 Allergic rhinitis due to animal (cat) (dog) hair and dander: Secondary | ICD-10-CM | POA: Diagnosis not present

## 2022-06-29 DIAGNOSIS — J301 Allergic rhinitis due to pollen: Secondary | ICD-10-CM | POA: Diagnosis not present

## 2022-07-09 DIAGNOSIS — J3089 Other allergic rhinitis: Secondary | ICD-10-CM | POA: Diagnosis not present

## 2022-07-09 DIAGNOSIS — J301 Allergic rhinitis due to pollen: Secondary | ICD-10-CM | POA: Diagnosis not present

## 2022-07-09 DIAGNOSIS — J3081 Allergic rhinitis due to animal (cat) (dog) hair and dander: Secondary | ICD-10-CM | POA: Diagnosis not present

## 2022-07-18 DIAGNOSIS — J3089 Other allergic rhinitis: Secondary | ICD-10-CM | POA: Diagnosis not present

## 2022-07-18 DIAGNOSIS — J301 Allergic rhinitis due to pollen: Secondary | ICD-10-CM | POA: Diagnosis not present

## 2022-07-26 DIAGNOSIS — J301 Allergic rhinitis due to pollen: Secondary | ICD-10-CM | POA: Diagnosis not present

## 2022-07-26 DIAGNOSIS — J3089 Other allergic rhinitis: Secondary | ICD-10-CM | POA: Diagnosis not present

## 2022-08-08 DIAGNOSIS — J3081 Allergic rhinitis due to animal (cat) (dog) hair and dander: Secondary | ICD-10-CM | POA: Diagnosis not present

## 2022-08-08 DIAGNOSIS — J3089 Other allergic rhinitis: Secondary | ICD-10-CM | POA: Diagnosis not present

## 2022-08-08 DIAGNOSIS — J301 Allergic rhinitis due to pollen: Secondary | ICD-10-CM | POA: Diagnosis not present

## 2022-08-16 DIAGNOSIS — J3089 Other allergic rhinitis: Secondary | ICD-10-CM | POA: Diagnosis not present

## 2022-08-16 DIAGNOSIS — J301 Allergic rhinitis due to pollen: Secondary | ICD-10-CM | POA: Diagnosis not present

## 2022-08-24 DIAGNOSIS — J3081 Allergic rhinitis due to animal (cat) (dog) hair and dander: Secondary | ICD-10-CM | POA: Diagnosis not present

## 2022-08-24 DIAGNOSIS — J301 Allergic rhinitis due to pollen: Secondary | ICD-10-CM | POA: Diagnosis not present

## 2022-08-24 DIAGNOSIS — J3089 Other allergic rhinitis: Secondary | ICD-10-CM | POA: Diagnosis not present

## 2022-09-05 DIAGNOSIS — J301 Allergic rhinitis due to pollen: Secondary | ICD-10-CM | POA: Diagnosis not present

## 2022-09-06 DIAGNOSIS — K2950 Unspecified chronic gastritis without bleeding: Secondary | ICD-10-CM | POA: Diagnosis not present

## 2022-09-06 DIAGNOSIS — R1012 Left upper quadrant pain: Secondary | ICD-10-CM | POA: Diagnosis not present

## 2022-09-06 DIAGNOSIS — K2970 Gastritis, unspecified, without bleeding: Secondary | ICD-10-CM | POA: Diagnosis not present

## 2022-09-13 DIAGNOSIS — J3089 Other allergic rhinitis: Secondary | ICD-10-CM | POA: Diagnosis not present

## 2022-09-13 DIAGNOSIS — J301 Allergic rhinitis due to pollen: Secondary | ICD-10-CM | POA: Diagnosis not present

## 2022-09-21 DIAGNOSIS — J301 Allergic rhinitis due to pollen: Secondary | ICD-10-CM | POA: Diagnosis not present

## 2022-09-21 DIAGNOSIS — J3081 Allergic rhinitis due to animal (cat) (dog) hair and dander: Secondary | ICD-10-CM | POA: Diagnosis not present

## 2022-09-21 DIAGNOSIS — J3089 Other allergic rhinitis: Secondary | ICD-10-CM | POA: Diagnosis not present

## 2022-09-28 DIAGNOSIS — J301 Allergic rhinitis due to pollen: Secondary | ICD-10-CM | POA: Diagnosis not present

## 2022-09-28 DIAGNOSIS — J3089 Other allergic rhinitis: Secondary | ICD-10-CM | POA: Diagnosis not present

## 2022-10-04 DIAGNOSIS — J3081 Allergic rhinitis due to animal (cat) (dog) hair and dander: Secondary | ICD-10-CM | POA: Diagnosis not present

## 2022-10-04 DIAGNOSIS — J301 Allergic rhinitis due to pollen: Secondary | ICD-10-CM | POA: Diagnosis not present

## 2022-10-04 DIAGNOSIS — J3089 Other allergic rhinitis: Secondary | ICD-10-CM | POA: Diagnosis not present

## 2022-10-19 DIAGNOSIS — J301 Allergic rhinitis due to pollen: Secondary | ICD-10-CM | POA: Diagnosis not present

## 2022-10-25 DIAGNOSIS — J3089 Other allergic rhinitis: Secondary | ICD-10-CM | POA: Diagnosis not present

## 2022-10-25 DIAGNOSIS — J301 Allergic rhinitis due to pollen: Secondary | ICD-10-CM | POA: Diagnosis not present

## 2022-10-25 DIAGNOSIS — J3081 Allergic rhinitis due to animal (cat) (dog) hair and dander: Secondary | ICD-10-CM | POA: Diagnosis not present

## 2022-11-02 DIAGNOSIS — J3089 Other allergic rhinitis: Secondary | ICD-10-CM | POA: Diagnosis not present

## 2022-11-02 DIAGNOSIS — J3081 Allergic rhinitis due to animal (cat) (dog) hair and dander: Secondary | ICD-10-CM | POA: Diagnosis not present

## 2022-11-02 DIAGNOSIS — J301 Allergic rhinitis due to pollen: Secondary | ICD-10-CM | POA: Diagnosis not present

## 2022-11-08 DIAGNOSIS — J301 Allergic rhinitis due to pollen: Secondary | ICD-10-CM | POA: Diagnosis not present

## 2022-11-08 DIAGNOSIS — J3081 Allergic rhinitis due to animal (cat) (dog) hair and dander: Secondary | ICD-10-CM | POA: Diagnosis not present

## 2022-11-08 DIAGNOSIS — J3089 Other allergic rhinitis: Secondary | ICD-10-CM | POA: Diagnosis not present

## 2022-11-15 DIAGNOSIS — J301 Allergic rhinitis due to pollen: Secondary | ICD-10-CM | POA: Diagnosis not present

## 2022-11-15 DIAGNOSIS — J3089 Other allergic rhinitis: Secondary | ICD-10-CM | POA: Diagnosis not present

## 2022-11-15 DIAGNOSIS — J3081 Allergic rhinitis due to animal (cat) (dog) hair and dander: Secondary | ICD-10-CM | POA: Diagnosis not present

## 2022-11-22 DIAGNOSIS — J301 Allergic rhinitis due to pollen: Secondary | ICD-10-CM | POA: Diagnosis not present

## 2022-11-22 DIAGNOSIS — J3089 Other allergic rhinitis: Secondary | ICD-10-CM | POA: Diagnosis not present

## 2022-12-06 DIAGNOSIS — J301 Allergic rhinitis due to pollen: Secondary | ICD-10-CM | POA: Diagnosis not present

## 2022-12-13 DIAGNOSIS — J3089 Other allergic rhinitis: Secondary | ICD-10-CM | POA: Diagnosis not present

## 2022-12-13 DIAGNOSIS — J301 Allergic rhinitis due to pollen: Secondary | ICD-10-CM | POA: Diagnosis not present

## 2022-12-13 DIAGNOSIS — J3081 Allergic rhinitis due to animal (cat) (dog) hair and dander: Secondary | ICD-10-CM | POA: Diagnosis not present

## 2022-12-20 DIAGNOSIS — J301 Allergic rhinitis due to pollen: Secondary | ICD-10-CM | POA: Diagnosis not present

## 2022-12-27 DIAGNOSIS — J301 Allergic rhinitis due to pollen: Secondary | ICD-10-CM | POA: Diagnosis not present

## 2023-01-03 DIAGNOSIS — J301 Allergic rhinitis due to pollen: Secondary | ICD-10-CM | POA: Diagnosis not present

## 2023-01-10 DIAGNOSIS — J301 Allergic rhinitis due to pollen: Secondary | ICD-10-CM | POA: Diagnosis not present

## 2023-01-17 DIAGNOSIS — J3089 Other allergic rhinitis: Secondary | ICD-10-CM | POA: Diagnosis not present

## 2023-01-17 DIAGNOSIS — J301 Allergic rhinitis due to pollen: Secondary | ICD-10-CM | POA: Diagnosis not present

## 2023-02-14 DIAGNOSIS — J301 Allergic rhinitis due to pollen: Secondary | ICD-10-CM | POA: Diagnosis not present

## 2023-02-21 DIAGNOSIS — J301 Allergic rhinitis due to pollen: Secondary | ICD-10-CM | POA: Diagnosis not present

## 2023-02-27 DIAGNOSIS — J301 Allergic rhinitis due to pollen: Secondary | ICD-10-CM | POA: Diagnosis not present

## 2023-03-05 DIAGNOSIS — J301 Allergic rhinitis due to pollen: Secondary | ICD-10-CM | POA: Diagnosis not present

## 2023-03-07 DIAGNOSIS — J3081 Allergic rhinitis due to animal (cat) (dog) hair and dander: Secondary | ICD-10-CM | POA: Diagnosis not present

## 2023-03-07 DIAGNOSIS — J301 Allergic rhinitis due to pollen: Secondary | ICD-10-CM | POA: Diagnosis not present

## 2023-03-07 DIAGNOSIS — J3089 Other allergic rhinitis: Secondary | ICD-10-CM | POA: Diagnosis not present

## 2023-03-13 DIAGNOSIS — J3081 Allergic rhinitis due to animal (cat) (dog) hair and dander: Secondary | ICD-10-CM | POA: Diagnosis not present

## 2023-03-13 DIAGNOSIS — J301 Allergic rhinitis due to pollen: Secondary | ICD-10-CM | POA: Diagnosis not present

## 2023-03-13 DIAGNOSIS — J3089 Other allergic rhinitis: Secondary | ICD-10-CM | POA: Diagnosis not present

## 2023-04-10 DIAGNOSIS — J3081 Allergic rhinitis due to animal (cat) (dog) hair and dander: Secondary | ICD-10-CM | POA: Diagnosis not present

## 2023-04-10 DIAGNOSIS — J301 Allergic rhinitis due to pollen: Secondary | ICD-10-CM | POA: Diagnosis not present

## 2023-04-10 DIAGNOSIS — J3089 Other allergic rhinitis: Secondary | ICD-10-CM | POA: Diagnosis not present

## 2023-04-25 DIAGNOSIS — J3081 Allergic rhinitis due to animal (cat) (dog) hair and dander: Secondary | ICD-10-CM | POA: Diagnosis not present

## 2023-04-25 DIAGNOSIS — J301 Allergic rhinitis due to pollen: Secondary | ICD-10-CM | POA: Diagnosis not present

## 2023-04-25 DIAGNOSIS — J3089 Other allergic rhinitis: Secondary | ICD-10-CM | POA: Diagnosis not present

## 2023-05-02 DIAGNOSIS — J3081 Allergic rhinitis due to animal (cat) (dog) hair and dander: Secondary | ICD-10-CM | POA: Diagnosis not present

## 2023-05-02 DIAGNOSIS — J3089 Other allergic rhinitis: Secondary | ICD-10-CM | POA: Diagnosis not present

## 2023-05-02 DIAGNOSIS — J301 Allergic rhinitis due to pollen: Secondary | ICD-10-CM | POA: Diagnosis not present

## 2023-05-09 DIAGNOSIS — J301 Allergic rhinitis due to pollen: Secondary | ICD-10-CM | POA: Diagnosis not present

## 2023-05-23 DIAGNOSIS — J301 Allergic rhinitis due to pollen: Secondary | ICD-10-CM | POA: Diagnosis not present

## 2023-05-30 DIAGNOSIS — J3081 Allergic rhinitis due to animal (cat) (dog) hair and dander: Secondary | ICD-10-CM | POA: Diagnosis not present

## 2023-05-30 DIAGNOSIS — J3089 Other allergic rhinitis: Secondary | ICD-10-CM | POA: Diagnosis not present

## 2023-05-30 DIAGNOSIS — J301 Allergic rhinitis due to pollen: Secondary | ICD-10-CM | POA: Diagnosis not present

## 2023-06-07 DIAGNOSIS — J301 Allergic rhinitis due to pollen: Secondary | ICD-10-CM | POA: Diagnosis not present

## 2023-06-07 DIAGNOSIS — J3081 Allergic rhinitis due to animal (cat) (dog) hair and dander: Secondary | ICD-10-CM | POA: Diagnosis not present

## 2023-06-07 DIAGNOSIS — J3089 Other allergic rhinitis: Secondary | ICD-10-CM | POA: Diagnosis not present

## 2023-06-14 DIAGNOSIS — J3089 Other allergic rhinitis: Secondary | ICD-10-CM | POA: Diagnosis not present

## 2023-06-14 DIAGNOSIS — J3081 Allergic rhinitis due to animal (cat) (dog) hair and dander: Secondary | ICD-10-CM | POA: Diagnosis not present

## 2023-06-14 DIAGNOSIS — J301 Allergic rhinitis due to pollen: Secondary | ICD-10-CM | POA: Diagnosis not present

## 2023-06-20 DIAGNOSIS — J301 Allergic rhinitis due to pollen: Secondary | ICD-10-CM | POA: Diagnosis not present

## 2023-06-27 DIAGNOSIS — J3089 Other allergic rhinitis: Secondary | ICD-10-CM | POA: Diagnosis not present

## 2023-06-27 DIAGNOSIS — J3081 Allergic rhinitis due to animal (cat) (dog) hair and dander: Secondary | ICD-10-CM | POA: Diagnosis not present

## 2023-06-27 DIAGNOSIS — J301 Allergic rhinitis due to pollen: Secondary | ICD-10-CM | POA: Diagnosis not present

## 2023-07-04 DIAGNOSIS — Z23 Encounter for immunization: Secondary | ICD-10-CM | POA: Diagnosis not present

## 2023-07-04 DIAGNOSIS — J301 Allergic rhinitis due to pollen: Secondary | ICD-10-CM | POA: Diagnosis not present

## 2023-07-04 DIAGNOSIS — Z0000 Encounter for general adult medical examination without abnormal findings: Secondary | ICD-10-CM | POA: Diagnosis not present

## 2023-07-04 DIAGNOSIS — E7800 Pure hypercholesterolemia, unspecified: Secondary | ICD-10-CM | POA: Diagnosis not present

## 2023-07-04 DIAGNOSIS — Z125 Encounter for screening for malignant neoplasm of prostate: Secondary | ICD-10-CM | POA: Diagnosis not present

## 2023-07-09 DIAGNOSIS — Z0000 Encounter for general adult medical examination without abnormal findings: Secondary | ICD-10-CM | POA: Diagnosis not present

## 2023-07-11 DIAGNOSIS — J301 Allergic rhinitis due to pollen: Secondary | ICD-10-CM | POA: Diagnosis not present

## 2023-07-11 DIAGNOSIS — J3081 Allergic rhinitis due to animal (cat) (dog) hair and dander: Secondary | ICD-10-CM | POA: Diagnosis not present

## 2023-07-11 DIAGNOSIS — J3089 Other allergic rhinitis: Secondary | ICD-10-CM | POA: Diagnosis not present

## 2023-07-18 DIAGNOSIS — J3089 Other allergic rhinitis: Secondary | ICD-10-CM | POA: Diagnosis not present

## 2023-07-18 DIAGNOSIS — J301 Allergic rhinitis due to pollen: Secondary | ICD-10-CM | POA: Diagnosis not present

## 2023-07-18 DIAGNOSIS — J3081 Allergic rhinitis due to animal (cat) (dog) hair and dander: Secondary | ICD-10-CM | POA: Diagnosis not present

## 2023-07-26 DIAGNOSIS — J301 Allergic rhinitis due to pollen: Secondary | ICD-10-CM | POA: Diagnosis not present

## 2023-07-26 DIAGNOSIS — J3081 Allergic rhinitis due to animal (cat) (dog) hair and dander: Secondary | ICD-10-CM | POA: Diagnosis not present

## 2023-07-26 DIAGNOSIS — J3089 Other allergic rhinitis: Secondary | ICD-10-CM | POA: Diagnosis not present

## 2023-08-02 DIAGNOSIS — J3081 Allergic rhinitis due to animal (cat) (dog) hair and dander: Secondary | ICD-10-CM | POA: Diagnosis not present

## 2023-08-02 DIAGNOSIS — J301 Allergic rhinitis due to pollen: Secondary | ICD-10-CM | POA: Diagnosis not present

## 2023-08-02 DIAGNOSIS — J3089 Other allergic rhinitis: Secondary | ICD-10-CM | POA: Diagnosis not present

## 2023-08-13 DIAGNOSIS — J301 Allergic rhinitis due to pollen: Secondary | ICD-10-CM | POA: Diagnosis not present

## 2023-08-20 DIAGNOSIS — J301 Allergic rhinitis due to pollen: Secondary | ICD-10-CM | POA: Diagnosis not present

## 2023-08-26 DIAGNOSIS — Z1211 Encounter for screening for malignant neoplasm of colon: Secondary | ICD-10-CM | POA: Diagnosis not present

## 2023-08-28 DIAGNOSIS — J301 Allergic rhinitis due to pollen: Secondary | ICD-10-CM | POA: Diagnosis not present

## 2023-09-10 DIAGNOSIS — K635 Polyp of colon: Secondary | ICD-10-CM | POA: Diagnosis not present

## 2023-09-10 DIAGNOSIS — Z860101 Personal history of adenomatous and serrated colon polyps: Secondary | ICD-10-CM | POA: Diagnosis not present

## 2023-09-10 DIAGNOSIS — Z1211 Encounter for screening for malignant neoplasm of colon: Secondary | ICD-10-CM | POA: Diagnosis not present

## 2023-09-12 DIAGNOSIS — J301 Allergic rhinitis due to pollen: Secondary | ICD-10-CM | POA: Diagnosis not present

## 2023-09-12 DIAGNOSIS — J3089 Other allergic rhinitis: Secondary | ICD-10-CM | POA: Diagnosis not present

## 2023-09-12 DIAGNOSIS — J3081 Allergic rhinitis due to animal (cat) (dog) hair and dander: Secondary | ICD-10-CM | POA: Diagnosis not present

## 2023-09-20 DIAGNOSIS — J301 Allergic rhinitis due to pollen: Secondary | ICD-10-CM | POA: Diagnosis not present

## 2023-09-20 DIAGNOSIS — J3089 Other allergic rhinitis: Secondary | ICD-10-CM | POA: Diagnosis not present

## 2023-09-20 DIAGNOSIS — J3081 Allergic rhinitis due to animal (cat) (dog) hair and dander: Secondary | ICD-10-CM | POA: Diagnosis not present

## 2023-09-26 DIAGNOSIS — J301 Allergic rhinitis due to pollen: Secondary | ICD-10-CM | POA: Diagnosis not present

## 2023-10-02 DIAGNOSIS — J301 Allergic rhinitis due to pollen: Secondary | ICD-10-CM | POA: Diagnosis not present

## 2023-10-02 DIAGNOSIS — J3089 Other allergic rhinitis: Secondary | ICD-10-CM | POA: Diagnosis not present

## 2023-10-10 DIAGNOSIS — J301 Allergic rhinitis due to pollen: Secondary | ICD-10-CM | POA: Diagnosis not present

## 2023-10-15 DIAGNOSIS — J301 Allergic rhinitis due to pollen: Secondary | ICD-10-CM | POA: Diagnosis not present

## 2023-10-15 DIAGNOSIS — J3081 Allergic rhinitis due to animal (cat) (dog) hair and dander: Secondary | ICD-10-CM | POA: Diagnosis not present

## 2023-10-15 DIAGNOSIS — J3089 Other allergic rhinitis: Secondary | ICD-10-CM | POA: Diagnosis not present

## 2023-10-22 DIAGNOSIS — J301 Allergic rhinitis due to pollen: Secondary | ICD-10-CM | POA: Diagnosis not present

## 2023-10-29 DIAGNOSIS — J301 Allergic rhinitis due to pollen: Secondary | ICD-10-CM | POA: Diagnosis not present

## 2023-10-29 DIAGNOSIS — J3089 Other allergic rhinitis: Secondary | ICD-10-CM | POA: Diagnosis not present

## 2023-10-29 DIAGNOSIS — J3081 Allergic rhinitis due to animal (cat) (dog) hair and dander: Secondary | ICD-10-CM | POA: Diagnosis not present

## 2023-11-05 DIAGNOSIS — J301 Allergic rhinitis due to pollen: Secondary | ICD-10-CM | POA: Diagnosis not present

## 2023-11-12 DIAGNOSIS — J301 Allergic rhinitis due to pollen: Secondary | ICD-10-CM | POA: Diagnosis not present

## 2023-11-19 DIAGNOSIS — J3089 Other allergic rhinitis: Secondary | ICD-10-CM | POA: Diagnosis not present

## 2023-11-19 DIAGNOSIS — J301 Allergic rhinitis due to pollen: Secondary | ICD-10-CM | POA: Diagnosis not present

## 2023-11-19 DIAGNOSIS — J3081 Allergic rhinitis due to animal (cat) (dog) hair and dander: Secondary | ICD-10-CM | POA: Diagnosis not present

## 2023-12-03 DIAGNOSIS — J301 Allergic rhinitis due to pollen: Secondary | ICD-10-CM | POA: Diagnosis not present

## 2023-12-10 DIAGNOSIS — J301 Allergic rhinitis due to pollen: Secondary | ICD-10-CM | POA: Diagnosis not present

## 2023-12-17 DIAGNOSIS — J3089 Other allergic rhinitis: Secondary | ICD-10-CM | POA: Diagnosis not present

## 2023-12-17 DIAGNOSIS — J301 Allergic rhinitis due to pollen: Secondary | ICD-10-CM | POA: Diagnosis not present

## 2023-12-17 DIAGNOSIS — J3081 Allergic rhinitis due to animal (cat) (dog) hair and dander: Secondary | ICD-10-CM | POA: Diagnosis not present

## 2023-12-24 DIAGNOSIS — J301 Allergic rhinitis due to pollen: Secondary | ICD-10-CM | POA: Diagnosis not present

## 2023-12-24 DIAGNOSIS — J3089 Other allergic rhinitis: Secondary | ICD-10-CM | POA: Diagnosis not present

## 2023-12-24 DIAGNOSIS — J3081 Allergic rhinitis due to animal (cat) (dog) hair and dander: Secondary | ICD-10-CM | POA: Diagnosis not present

## 2023-12-31 DIAGNOSIS — J301 Allergic rhinitis due to pollen: Secondary | ICD-10-CM | POA: Diagnosis not present

## 2024-01-07 DIAGNOSIS — J301 Allergic rhinitis due to pollen: Secondary | ICD-10-CM | POA: Diagnosis not present

## 2024-01-15 DIAGNOSIS — J3081 Allergic rhinitis due to animal (cat) (dog) hair and dander: Secondary | ICD-10-CM | POA: Diagnosis not present

## 2024-01-15 DIAGNOSIS — J301 Allergic rhinitis due to pollen: Secondary | ICD-10-CM | POA: Diagnosis not present

## 2024-01-15 DIAGNOSIS — J3089 Other allergic rhinitis: Secondary | ICD-10-CM | POA: Diagnosis not present

## 2024-01-21 DIAGNOSIS — J301 Allergic rhinitis due to pollen: Secondary | ICD-10-CM | POA: Diagnosis not present

## 2024-02-05 DIAGNOSIS — J3089 Other allergic rhinitis: Secondary | ICD-10-CM | POA: Diagnosis not present

## 2024-02-05 DIAGNOSIS — J301 Allergic rhinitis due to pollen: Secondary | ICD-10-CM | POA: Diagnosis not present

## 2024-02-05 DIAGNOSIS — J3081 Allergic rhinitis due to animal (cat) (dog) hair and dander: Secondary | ICD-10-CM | POA: Diagnosis not present

## 2024-02-11 DIAGNOSIS — J301 Allergic rhinitis due to pollen: Secondary | ICD-10-CM | POA: Diagnosis not present

## 2024-02-18 DIAGNOSIS — J301 Allergic rhinitis due to pollen: Secondary | ICD-10-CM | POA: Diagnosis not present

## 2024-02-18 DIAGNOSIS — J3089 Other allergic rhinitis: Secondary | ICD-10-CM | POA: Diagnosis not present

## 2024-02-18 DIAGNOSIS — J3081 Allergic rhinitis due to animal (cat) (dog) hair and dander: Secondary | ICD-10-CM | POA: Diagnosis not present

## 2024-02-25 DIAGNOSIS — J301 Allergic rhinitis due to pollen: Secondary | ICD-10-CM | POA: Diagnosis not present

## 2024-03-03 DIAGNOSIS — J301 Allergic rhinitis due to pollen: Secondary | ICD-10-CM | POA: Diagnosis not present

## 2024-03-03 DIAGNOSIS — J3089 Other allergic rhinitis: Secondary | ICD-10-CM | POA: Diagnosis not present

## 2024-03-03 DIAGNOSIS — J3081 Allergic rhinitis due to animal (cat) (dog) hair and dander: Secondary | ICD-10-CM | POA: Diagnosis not present

## 2024-03-10 DIAGNOSIS — J301 Allergic rhinitis due to pollen: Secondary | ICD-10-CM | POA: Diagnosis not present

## 2024-03-10 DIAGNOSIS — J3081 Allergic rhinitis due to animal (cat) (dog) hair and dander: Secondary | ICD-10-CM | POA: Diagnosis not present

## 2024-03-10 DIAGNOSIS — J3089 Other allergic rhinitis: Secondary | ICD-10-CM | POA: Diagnosis not present

## 2024-03-17 DIAGNOSIS — J301 Allergic rhinitis due to pollen: Secondary | ICD-10-CM | POA: Diagnosis not present

## 2024-03-26 DIAGNOSIS — J301 Allergic rhinitis due to pollen: Secondary | ICD-10-CM | POA: Diagnosis not present

## 2024-03-26 DIAGNOSIS — J3081 Allergic rhinitis due to animal (cat) (dog) hair and dander: Secondary | ICD-10-CM | POA: Diagnosis not present

## 2024-03-26 DIAGNOSIS — J3089 Other allergic rhinitis: Secondary | ICD-10-CM | POA: Diagnosis not present

## 2024-04-07 DIAGNOSIS — J3089 Other allergic rhinitis: Secondary | ICD-10-CM | POA: Diagnosis not present

## 2024-04-07 DIAGNOSIS — J3081 Allergic rhinitis due to animal (cat) (dog) hair and dander: Secondary | ICD-10-CM | POA: Diagnosis not present

## 2024-04-07 DIAGNOSIS — J301 Allergic rhinitis due to pollen: Secondary | ICD-10-CM | POA: Diagnosis not present

## 2024-06-02 DIAGNOSIS — J3081 Allergic rhinitis due to animal (cat) (dog) hair and dander: Secondary | ICD-10-CM | POA: Diagnosis not present

## 2024-06-02 DIAGNOSIS — J301 Allergic rhinitis due to pollen: Secondary | ICD-10-CM | POA: Diagnosis not present

## 2024-06-02 DIAGNOSIS — J3089 Other allergic rhinitis: Secondary | ICD-10-CM | POA: Diagnosis not present

## 2024-06-09 DIAGNOSIS — J3081 Allergic rhinitis due to animal (cat) (dog) hair and dander: Secondary | ICD-10-CM | POA: Diagnosis not present

## 2024-06-09 DIAGNOSIS — J3089 Other allergic rhinitis: Secondary | ICD-10-CM | POA: Diagnosis not present

## 2024-06-09 DIAGNOSIS — J301 Allergic rhinitis due to pollen: Secondary | ICD-10-CM | POA: Diagnosis not present

## 2024-06-17 DIAGNOSIS — J301 Allergic rhinitis due to pollen: Secondary | ICD-10-CM | POA: Diagnosis not present

## 2024-06-26 DIAGNOSIS — J301 Allergic rhinitis due to pollen: Secondary | ICD-10-CM | POA: Diagnosis not present

## 2024-06-26 DIAGNOSIS — J3081 Allergic rhinitis due to animal (cat) (dog) hair and dander: Secondary | ICD-10-CM | POA: Diagnosis not present

## 2024-06-26 DIAGNOSIS — J3089 Other allergic rhinitis: Secondary | ICD-10-CM | POA: Diagnosis not present

## 2024-06-30 DIAGNOSIS — J301 Allergic rhinitis due to pollen: Secondary | ICD-10-CM | POA: Diagnosis not present

## 2024-07-07 DIAGNOSIS — J301 Allergic rhinitis due to pollen: Secondary | ICD-10-CM | POA: Diagnosis not present
